# Patient Record
Sex: Male | Born: 2005
Health system: Western US, Academic
[De-identification: ages and names within clinical notes are randomized; demographics above are authoritative.]

## PROBLEM LIST (undated history)

## (undated) DIAGNOSIS — J45909 Unspecified asthma, uncomplicated: Secondary | ICD-10-CM

## (undated) DIAGNOSIS — T7840XA Allergy, unspecified, initial encounter: Secondary | ICD-10-CM

## (undated) HISTORY — DX: Unspecified asthma, uncomplicated: J45.909

## (undated) HISTORY — DX: Allergy, unspecified, initial encounter: T78.40XA

---
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
## 2023-01-06 ENCOUNTER — Ambulatory Visit: Payer: BLUE CROSS/BLUE SHIELD

## 2023-01-06 DIAGNOSIS — G8929 Other chronic pain: Secondary | ICD-10-CM

## 2023-01-06 DIAGNOSIS — M25512 Pain in left shoulder: Secondary | ICD-10-CM

## 2023-01-06 NOTE — Progress Notes
Procedures   Arthrogram with intra-articular introduction of contrast  and needle placement under ultrasound guidance  of the left shoulder      Performed by:  Viveca Beckstrom M. Yolander Goodie, MD      Procedure:  Following informed consent the left shoulder was prepped in a sterile manner.  The shoulder joint was identified using a GE LogiqE ultrasound machine and 12mhz linear probe.  The ultrasound guidance is medically necessary to confirm placement of the contrast solution intra-articularly within the joint.  The shoulder was injected with lidocaine for local anesthetic.  Via an anterior approach a 22-guage needle was then advanced under ultrasound guidance into the shoulder joint and 20 ml of a 1:100 gadolinium/saline solution was introduced into the joint.  A bandage was then applied and the shoulder was rotated several times.      The patient tolerated the procedure well without any immediate complications.  The patient then underwent MRI scan of the shoulder.

## 2023-02-24 ENCOUNTER — Ambulatory Visit: Payer: BLUE CROSS/BLUE SHIELD

## 2023-04-17 ENCOUNTER — Ambulatory Visit: Payer: BLUE CROSS/BLUE SHIELD

## 2023-04-17 ENCOUNTER — Inpatient Hospital Stay: Payer: BLUE CROSS/BLUE SHIELD

## 2023-04-17 DIAGNOSIS — S99921A Unspecified injury of right foot, initial encounter: Secondary | ICD-10-CM

## 2023-04-17 MED ADMIN — TETANUS-DIPHTH-ACELL PERTUSSIS 5-2.5-18.5 LF-MCG/0.5 IM SUSY: .5 mL | INTRAMUSCULAR | @ 22:00:00 | Stop: 2023-04-17

## 2023-04-18 ENCOUNTER — Ambulatory Visit: Payer: BLUE CROSS/BLUE SHIELD

## 2023-04-18 DIAGNOSIS — S92411B Displaced fracture of proximal phalanx of right great toe, initial encounter for open fracture: Secondary | ICD-10-CM

## 2023-04-18 MED ORDER — OXYCODONE HCL 5 MG PO TABS
5 mg | ORAL_TABLET | ORAL | 0 refills | Status: AC | PRN
Start: 2023-04-18 — End: ?

## 2023-04-18 MED ORDER — CEPHALEXIN 500 MG PO CAPS
500 mg | ORAL_CAPSULE | Freq: Four times a day (QID) | ORAL | 0 refills | Status: AC
Start: 2023-04-18 — End: ?

## 2023-04-18 NOTE — Progress Notes
[facility]  [address]  [address]  [place] [address]  Phone: [phone]  FAX: [phone]    URGENT CARE VISIT - ADULT    Subjective:     CC  Foot Injury (Rt foot injury yesterday )    HPI  Patrick Raymond is a 17 y.o. male with PMH lumbar sprain, presenting w/ concern for R foot injury.     Pt was barefoot yesterday in his friend's backyard when he dropped a large fire grate (~50lbs) onto his foot. Had a significant laceration to the area at the base of his first toe. Significant pain with greatly decreased ROM of first toe. Entire foot is now swollen with decreased sensation in tips of all toes. Pt with significant pain and has not placed dressing on foot.         Review of Systems  Complete review of systems was performed and found to be negative except as noted in HPI.     Past Medical History  Patient Active Problem List   Diagnosis    Lumbar sprain    Onychocryptosis     He has no past medical history on file.    Social History  Social History     Socioeconomic History    Marital status: Single   Tobacco Use    Smoking status: Never    Smokeless tobacco: Never     Medications/Supplements  Medications that the patient states to be currently taking   Medication Sig    Clindamycin Phos-Benzoyl Perox 1.2-3.75 % GEL      Current Facility-Administered Medications for the 04/17/23 encounter (Office Visit) with Antony Blackbird., MD   Medication    [COMPLETED] Tdap vaccine (Boostrix) inj (Age 15 years and older) 0.5 mL     Allergies  Patient has no known allergies.    Objective:     Physical Exam  BP 121/77  ~ Pulse 90  ~ Resp 18  ~ Ht 5' 10'' (1.778 m)  ~ Wt 165 lb (74.8 kg)  ~ SpO2 99%  ~ BMI 23.68 kg/m?   General: Appears comfortable, no acute distress, A&Ox4  HEENT: NCAT, PERRL, sclera non-icteric, OP clear, MMM  Neck: Supple, FROM, no stridor  Resp: CTAB, no r/w/r  Skin: No rashes, no jaundice  Ext: No c/c/e, WWP  Musculoskeletal: 2cm laceration at base of R 1st toe, significant edema and erythema throughout R foot, decreased sensation at tips of all toes. No AROM of 1st great toe.   Neuro: MAE spont. Sensation grossly intact throughout  Psychiatric: Mood, memory, affect and judgment normal    Recent Labs/Studies  No results found for this or any previous visit (from the past 168 hour(s)).    Recent Imaging Results  XR foot ap+lat+obl right (3 views)    Result Date: 04/17/2023  IMPRESSION: Acute transverse fracture of the first proximal phalanx, which exhibits dorsomedial displacement by nearly 1 shaft length. Surrounding soft tissue swelling. No dislocation. Incidental bipartite medial hallux sesamoid. Joint spaces are maintained. ATTESTATION: I, Maryellen Pile, M.D., have reviewed this radiological study personally, and I am in full agreement with the findings of the report presented above. Dictated by: Madelyn Brunner   04/17/2023 3:25 PM Signed by: Maryellen Pile   04/17/2023 3:35 PM     Assessment/Plan:     1. Injury of right foot, initial encounter  Tdap vaccine (Boostrix) inj (Age 15 years and older) 0.5 mL    XR foot ap+lat+obl right (3  views)        - Displaced acute transverse 1st proximal phalanx fracture with surrounding soft tissue swelling concerning for possible incipient infection.   - Tdap x1 given dirty wound and 8 years since last Tdap.   - Advised pt to present to ER for IV antibiotics, management of acute fracture, suturing of laceration. Pt agreeable with plan.   - Provided crutches, dressed wound.     40 minutes was spent by me performing a pre-visit review of the chart, obtaining an appropriate history/examining the patient, performing an evaluation, documenting, counseling, providing aftercare instructions, and communication with nursing staff for this patient's encounter today, with details supported within the note for today's visit. The time documented was exclusive of any time spent on the separately billed procedure.    No follow-ups on file.     New or Modified Medications for this Encounter   New    No medications on file   Modified    No medications on file   Discontinued Medications    No medications on file        Antony Blackbird, MD  04/17/2023 at 9:14 PM

## 2023-04-18 NOTE — Consults
04/18/2023       The patient was seen in our [facility] office.    Chief Complaint:Pain of the Right Foot    History of the present illness:    Patrick Raymond is a 17 y.o. right handed male who notes that they experienced the onset of pain on 04/17/2023.    The pain is located in the big toe.  .    The patient rates the pain 7 on a scale of 1 to 10 and describes the pain as throbbing. The pain is staying the same. The pain is associated with swelling  . The patient states that nothing makes the pain better. The patient states that moving it around makes the pain worse.      Patient can no longer perform the following activity due to the injury:walk    Injury occur at work:    Injury reported to work:      The patient relates prior injury:No    Other physician seen for this problem:Yes [facility]     Treatment thus far has included:                          Crutches Duration: 1 day Did it help:: Yes                Tests performed:    X-Jadore Mcguffin Date and Location: 04/18/2023                     Past Medical History:    Patient has a past medical history of Asthma.     Past Surgical History:    Patient has no past surgical history on file.      Medications:    Patient has a current medication list which includes the following prescription(s): adapalene, clindamycin phos-benzoyl perox, and doxycycline hyclate.      Allergies:    Patient has no known allergies.   Latex Allergy:No  Tape Allergy:No    Family History:     Patient family history is not on file.     Social History:    Patient reports that he has never smoked. He has never used smokeless tobacco. He reports that he does not drink alcohol and does not use drugs.     Living Situation:with family   Hobbies/Sports:golfing, fishing        04/18/2023     7:32 AM   Review of Systems   Fevers No   Unusual fatigue No   Very sudden vision change No   Eye pain or irritation No   Runny nose No   Sore throat No   Chest pain No Swelling of legs/ankles No   Cough No   Shortness of breath No   Nausea No   Vomiting No   Abdominal pain No   Diarrhea No   Constipation No   Genital lesions No   Unusual discharge No   Pain on urination No   Body aches No   Joint aches No   Skin lesions No   Rash No   Weakness No   Numbness No   Depression No   Anxiety No   Weight change No   Hair loss No   Unusually frequent urination No   Bleeding No   Swollen lymph nodes (glands) No   Allergies No   Unusually frequent infections No

## 2023-04-18 NOTE — Progress Notes
Date:  04/18/2023  Name:  Patrick Raymond  Centricity#: [id number]  DOB:   [birth year]  Age:   17 y.o.      Vernal Rutan D. [name], MD    The patient was seen in my [facility] office.    Referring Physician: None    Chief Complaint: Right Foot Pain    History of Present Illness: This is an 17 y.o. male with a 2 day history of pain in his right foot. The date of injury was Sunday night. He was attempting to carry a firepit when it dropped onto his right foot. He was seen at [facility] where he was evaluated with X-RAYs, showing a fracture of the 1st proximal phalanx. The right great toe sustained open wounds and dressings were applied over. He was then placed into a fracture shoe and put on crutches.    Present Complaints: Pain is localized to the right foot. The patient rates the pain 7 on a scale of 1 to 10 and describes the pain as throbbing. The pain is staying the same. The pain is associated with swelling. The patient states that nothing makes the pain better. The patient states that moving it around makes the pain worse.     Past History of Present Illness: [As stated above]    Occupation: Consulting civil engineer     Hobbies/Sports: Golfing, Fishing    PAST MEDICAL HISTORY  Allergies: No Known Allergies    Medications:   Outpatient Medications Prior to Visit   Medication Sig    adapalene 0.3% gel APPLY A PEA SIZE AMOUNT TO ACNE 3X A WEEK AT NIGHT.    Clindamycin Phos-Benzoyl Perox 1.2-3.75 % GEL     doxycycline hyclate 100 mg capsule      No facility-administered medications prior to visit.     Medical History:   Past Medical History:   Diagnosis Date    Asthma      Surgical History: No past surgical history on file.    Family History: No family history on file.    Social History: Patient reports that he has never smoked. He has never used smokeless tobacco. He reports that he does not drink alcohol and does not use drugs.     Review of Systems: Mostly good health throughout lifetime. No recent fevers, chills, or unexpected weight loss. The review of remaining systems: HEENT, Endocrine, Skin, CV, Respiratory, GI, GU, Neuro/Psych, Hematologic are all negative. Musculoskeletal is discussed as above.    Vital Signs:  Vitals:    04/18/23 1306   Weight: 165 lb (74.8 kg)   Height: 5' 10'' (1.778 m)     Last Recorded Vital Signs:    04/18/23 1306   BP: 142/69   Pulse: 71     PHYSICAL EXAM:   Constitutional: The patient is well developed, well nourished, and in no acute distress. Patient was seen with his parents.  Eyes: No conjunctivitis is present; eyelids appear normal.  Cardiovascular: Intact with normal pulses, skin temperature. No cyanosis, clubbing, or edema is evident.  Psych: Patient is alert and oriented x 3.  Pulmonary: Normal respiratory rate and effort.  Vascular: Intact with normal skin temperature, no varicosities.  Neuro: Normal coordination, sensation to light touch grossly intact.  Skin: No lesions, scars, erythema, edema, or other skin changes.    ANKLE/FOOT EXAMINATION: Examination of the right foot reveals the following findings.  APPLIANCES: Patient is not wearing any brace or support.   VISUAL ALIGNMENT: Standing alignment reveals hindfoot valgus.  FOOT MORPHOLOGY: Morphology reveals pes planus.   APPEARANCE: He has significant swelling, ecchymosis, edema. There are lacerations of the great toe.  GAIT: Patient cannot walk. He cannot walk on his heels and tiptoes. He cannot perform a single toe-leg raise.   LIGAMENTOUS LAXITY: There is no generalized ligamentous laxity.   PALPATION: Patient is tender to palpation of the right great toe.  GREAT TOE RANGE OF MOTION: Diminished in all planes.  ANKLE RANGE OF MOTION: Diminished in all planes.  MOTOR TESTING: Strength is 3/5 in all planes.     LIGAMENT STABILITY: Stable.  SENSATION: Sensation is intact to light touch.     X-RAYS: X-RAYs of the right foot from Urgent Care on 04/17/23 show Acute transverse fracture of the first proximal phalanx, which exhibits dorsomedial displacement by nearly 1 shaft length. Surrounding soft tissue swelling. No dislocation. Incidental bipartite medial hallux sesamoid. Joint spaces are maintained.    IMPRESSION:   Significant acute open transverse fracture of the right 1st proximal phalanx with displacement.    PLAN:   Under sterile conditions, the right great toe lacerations were debrided and cleaned. New foot dressings were applied over.  Due to the open displaced nature of the 1st proximal phalanx fracture, he requires surgery urgently. This includes a irrigation and debridement of the open fracture proximal phalanx and ORIF of the displaced right 1st proximal phalanx fracture. We discussed with the patient and his parents about surgery, post-operative care, and recovery.  He will go into bulky jones dressings and removable splint. He will remain nonweightbearing.  He will continue to elevate the right foot to reduce swelling.  He will start on Keflex 500 mg today.  The patient was provided with a leg wedge pillow today to facilitate elevation of the extremity after surgery. Significant swelling can occur after surgery, leading to prolonged wound healing, persistent pain, and delayed recovery.  This special pillow keeps the ankle and foot position to facilitate draining, prevent twisting or turning, and promotes quicker healing while allowing the patient to rest comfortable while recovering from the procedure. In addition, when patients are being transferred back and forth to their office appointments from their home, they use this pillow in the car while sitting in the back seat. Therefore, this supply is felt to be necessary to help in the recovery process.   We will proceed with surgery. The patient is aware of the risks of bleeding, infection, damage to the nerves/vessels, persistent pain and stiffness, malunion, nonunion, need of hardware removal, loss of limb, loss of life. The patient gives informed consent understanding clearly the benefits, risks and alternatives.    TIME: I spent a total of 50 minutes today to provide care for this patient. This includes time spent prior to, during, and after the patient's appointment to:   Review the patient's past medical history as well as any relevant prior testing, laboratory and imaging results in preparation for the visit  2. Obtain an adequate history and understanding of the patient's chief complaint  3. Perform the necessary examination and/or order any tests, labs, or imaging for further evaluation  4. Discuss the plan and any differential/diagnoses with the patient  5. Counsel/educate the patient if needed  6. Coordinate care for the patient if needed.       I have reviewed my findings with the patient. All questions were answered and the patient stated understanding. He was advised to call if he has any additional questions or concerns.  cc: []           Scribe Attestation:    I, [name], was the scribe for patient Patrick Raymond on 04/18/2023 in the presence of Dr. Philis Fendt.       Physician Signatures:    I, Dr. Philis Fendt, personally performed the services described in this documentation, as scribed by [name] in my presence, and it is both accurate and complete.       Philis Fendt, MD, FAOA  Assistant Clinical Professor of Orthopedic Surgery, [facility]  Director, Sports Medicine Fellowship, [facility]  04/18/2023

## 2023-04-27 ENCOUNTER — Ambulatory Visit: Payer: BLUE CROSS/BLUE SHIELD

## 2023-04-27 DIAGNOSIS — S92411B Displaced fracture of proximal phalanx of right great toe, initial encounter for open fracture: Secondary | ICD-10-CM

## 2023-04-27 NOTE — Progress Notes
Date:  04/27/2023  Name:  Patrick Raymond  Centricity#: [id number]  DOB:   April 19, 2006  Age:   17 y.o.    Patrick Baskette D. Patrick Passy, MD      The patient was seen in our [facility] office.    INTERIM HISTORY: Patrick Raymond presents today for a follow up visit on his right foot. He is 1 week s/p irrigation, debridement, and ORIF of the displaced open great toe proximal phalanx fracture on 04/19/23. He is overall doing well post surgery.    PHYSICAL EXAMINATION: On exam, patient is a pleasant male, alert and oriented x3. He presents with a cast and uses crutches to ambulate. He was seen with his father. Examination of the right foot show well healing incisions with stitches in place. There is swelling and resolving ecchymosis along the incision sites. No signs of infection. His range of motion and strength are both diminished. He is able to wiggle his toes and gently move the ankle. Sensation is intact to light touch.    X-RAYS: None    IMPRESSION:   He is making good progress s/p irrigation, debridement, and ORIF of the displaced open great toe proximal phalanx fracture on 04/19/23 by Dr. Philis Fendt.    PLAN:   Under sterile conditions, the incision sites were cleaned and new dressings were applied. Stitches were left in place.  He will go into a shortleg cast and continue to be nonweightbearing.  He will continue to keep the left ankle elevated and dry.  Return to clinic in 2 weeks for evaluation. At that time he should have new X-RAYs, 3 views of the right foot, for review.    I have reviewed my findings with the patient. All questions were answered and the patient stated understanding. He was advised to call if he has any additional questions or concerns.          Scribe Attestation:    I, [name], was the scribe for patient Patrick Raymond on 04/27/2023 in the presence of Dr. Philis Fendt.       Physician Signatures:    I, Dr. Philis Fendt, personally performed the services described in this documentation, as scribed by [name] in my presence, and it is both accurate and complete.       Philis Fendt, MD, FAOA  Assistant Clinical Professor of Orthopedic Surgery, [facility]  Director, Sports Medicine Fellowship, [facility]  04/27/2023

## 2023-05-09 ENCOUNTER — Ambulatory Visit: Payer: BLUE CROSS/BLUE SHIELD

## 2023-05-10 MED ORDER — CEPHALEXIN 500 MG PO CAPS
500 mg | ORAL_CAPSULE | Freq: Four times a day (QID) | ORAL | 0 refills | Status: AC
Start: 2023-05-10 — End: ?

## 2023-05-11 ENCOUNTER — Ambulatory Visit: Payer: BLUE CROSS/BLUE SHIELD

## 2023-05-11 DIAGNOSIS — S92411B Displaced fracture of proximal phalanx of right great toe, initial encounter for open fracture: Secondary | ICD-10-CM

## 2023-05-11 NOTE — Progress Notes
Date:  05/11/2023  Name:  Patrick Raymond  Centricity#: [id number]  DOB:   06-08-2005  Age:   17 y.o.    Shawnmichael Parenteau D. [name], MD      The patient was seen in our [facility] office.    INTERIM HISTORY: Odes Pint presents today for a follow up visit on his right foot. He is 3 weeks s/p irrigation, debridement, and ORIF of the displaced open great toe proximal phalanx fracture on 04/19/23. He is overall doing fine. Patient's lab work came back positive for Staphylococcus lugdunensis. We sent him antibiotics on Tuesday of this which which his mother states they have picked up and he is now taking.    PHYSICAL EXAMINATION: On exam, patient is a pleasant male, alert and oriented x3. He presents with a cast and uses crutches to ambulate. He was seen with his mother. Examination of the right foot show well healing incisions with stitches in place. There is swelling and resolving ecchymosis along the incision sites. The pin remains in position at the great toe proximal phalanx. No obvious signs of infection. No evidence of DVT. His range of motion and strength are both diminished. He is able to wiggle his toes and gently move the ankle. Sensation is intact to light touch.    X-RAYS: 3 views of the right foot were obtained and reviewed today. These reveal 3 pins in good position and alignment at the ORIF site of the great toe proximal phalanx fracture.    IMPRESSION:   S/p rare Staphylococcus lugdunensis infection, r/o contamination.   S/p irrigation, debridement, and ORIF of the displaced open great toe proximal phalanx fracture on 04/19/23 by Dr. Philis Fendt.    PLAN:   Under sterile conditions, the incision sites were cleaned and stitches were removed. New dressings were applied.  He will go into a shortleg cast and continue to be nonweightbearing.  He will continue to keep the left ankle elevated and dry.  He will continue taking Keflex 500 mg for 4 weeks.  We will remove his pin(s) in 6 weeks post-op but may require surgical removal pending healing.  Return to clinic in 2 weeks for evaluation.    I have reviewed my findings with the patient. All questions were answered and the patient stated understanding. He was advised to call if he has any additional questions or concerns.          Scribe Attestation:    I, [name], was the scribe for patient Krishay Allert on 05/11/2023 in the presence of Dr. Philis Fendt.       Physician Signatures:    I, Dr. Philis Fendt, personally performed the services described in this documentation, as scribed by [name] in my presence, and it is both accurate and complete.       Philis Fendt, MD, FAOA  Assistant Clinical Professor of Orthopedic Surgery, [facility]  Director, Sports Medicine Fellowship, [facility]  05/11/2023

## 2023-05-12 ENCOUNTER — Ambulatory Visit: Payer: BLUE CROSS/BLUE SHIELD

## 2023-05-12 DIAGNOSIS — S92411B Displaced fracture of proximal phalanx of right great toe, initial encounter for open fracture: Secondary | ICD-10-CM

## 2023-05-16 MED ORDER — CEPHALEXIN 500 MG PO CAPS
500 mg | ORAL_CAPSULE | Freq: Four times a day (QID) | ORAL | 0 refills | Status: AC
Start: 2023-05-16 — End: ?

## 2023-05-16 NOTE — Addendum Note
Addended by: Juanita Laster on: 05/16/2023 01:31 PM     Modules accepted: Orders

## 2023-05-24 ENCOUNTER — Ambulatory Visit: Payer: BLUE CROSS/BLUE SHIELD

## 2023-05-24 DIAGNOSIS — M79672 Pain in left foot: Secondary | ICD-10-CM

## 2023-05-24 NOTE — Procedures
Patient came to [facility] cast room for a cast change.

## 2023-05-27 MED ORDER — CEPHALEXIN 500 MG PO CAPS
500 mg | ORAL_CAPSULE | Freq: Four times a day (QID) | ORAL | 0 refills
Start: 2023-05-27 — End: ?

## 2023-05-29 MED ORDER — CEPHALEXIN 500 MG PO CAPS
500 mg | ORAL_CAPSULE | Freq: Four times a day (QID) | ORAL | 0 refills
Start: 2023-05-29 — End: ?

## 2023-06-02 ENCOUNTER — Ambulatory Visit: Payer: BLUE CROSS/BLUE SHIELD

## 2023-06-02 DIAGNOSIS — S92411B Displaced fracture of proximal phalanx of right great toe, initial encounter for open fracture: Secondary | ICD-10-CM

## 2023-06-02 MED ORDER — CEPHALEXIN 500 MG PO CAPS
500 mg | ORAL_CAPSULE | Freq: Four times a day (QID) | ORAL | 0 refills | Status: AC
Start: 2023-06-02 — End: ?

## 2023-06-03 MED ORDER — HYDROCODONE-ACETAMINOPHEN 5-325 MG PO TABS
1 | ORAL_TABLET | Freq: Four times a day (QID) | ORAL | 0 refills | Status: AC | PRN
Start: 2023-06-03 — End: ?

## 2023-06-06 NOTE — Progress Notes
Marcellous Snarski D. [name], MD    [place], [place] [id number]    June 02, 2023    The patient was seen in my office.         A followup visit on a private patient regarding his right foot.         HISTORY/INTERIM HISTORY:  The patient returns today.  He is about 6 weeks status post ORIF open fracture of his proximal phalanx of the great toe.  He is feeling better.  He has minimal pain.  He has been ambulating in his cast.         PHYSICAL EXAMINATION:  Pleasant male oriented x3.  The toe looks good.  The swelling is down.  Pins are dry and clean.  There is no evidence of any infection.  Good capillary fill.  Neurologically unchanged.         ASSESSMENT:  Status post ORIF, right great toe proximal phalanx open fracture.         PLAN:  We are taking him to surgery to remove the central pin and the lateral pin.  We may leave the medial pin in for a couple more weeks and make a decision at the time of surgery.  We also will manipulate his toe very gently to try to get some of the stiffness out of the IP joint under fluoroscopy.         The patient understands the proposed procedure, including the risks of bleeding, infection, damage to the nerves/vessels, refracture, need for further surgery, loss of limb, loss of life. He gives informed consent, understanding clearly the benefits, risks, and alternatives.         The patient did have 3 views of his right foot today.  It showed the 3 pins in place.  The fracture is healing medially and laterally.  He is not 100% healed yet, but it looks significantly better than previously seen.                                                                           Uilani Sanville D. [name], MD  ORTHOPEDIC SURGERY  Document electronically signed in EHR. See separate signature page.      J:  [id number]  D:  06/04/23  T:  06/05/23

## 2023-06-08 ENCOUNTER — Ambulatory Visit: Payer: BLUE CROSS/BLUE SHIELD

## 2023-06-08 DIAGNOSIS — S92411B Displaced fracture of proximal phalanx of right great toe, initial encounter for open fracture: Secondary | ICD-10-CM

## 2023-06-09 NOTE — Progress Notes
Date:  06/08/2023  Name:  Patrick Raymond  Centricity#: [id number]  DOB:   March 30, 2006  Age:   18 y.o.    Patrick Raymond D. Patrick Passy, MD      The patient was seen in our [facility] office.    INTERIM HISTORY: Patrick Raymond presents today for a follow up visit on his right foot. He is 1 week s/p removal of K-wire from across the IP joint, removal of K-wire from the lateral aspect of the proximal phalanx across the fracture site, and manipulation of the right great toe IP joint under fluoroscopy on 06/02/23. He is overall doing well post-surgery.    PHYSICAL EXAMINATION: On exam, patient is a pleasant male, alert and oriented x3. He presents in a splint and uses crutches. Patient was seen with his mother. Examination of the right foot show well healing incisions. There is swelling and resolving ecchymosis along the incision sites. No signs of infection. His range of motion and strength are both diminished. He is able to wiggle his toes and gently move the ankle. Sensation is intact to light touch    X-RAYS: None    IMPRESSION:   He is making good progress s/p removal of K-wire from across the IP joint, removal of K-wire from the lateral aspect of the proximal phalanx across the fracture site, and manipulation of the right great toe IP joint under fluoroscopy on 06/02/23 by Dr. Philis Raymond.  S/p irrigation, debridement, and ORIF of the displaced open great toe proximal phalanx fracture on 04/19/23 by Dr. Philis Raymond.    PLAN:   Under sterile conditions, the incision sites were cleaned and foot strapping was applied.  He will go into a short leg boot today. Increase WB.  He will continue to keep the left ankle elevated and dry.  Return to clinic in 1 week for evaluation.    I have reviewed my findings with the patient. All questions were answered and the patient stated understanding. He was advised to call if he has any additional questions or concerns.          Scribe Attestation:    I, Patrick Raymond, was the scribe for patient Patrick Raymond on 06/08/2023 in the presence of Dr. Philis Raymond.       Physician Signatures:    I, Dr. Philis Raymond, personally performed the services described in this documentation, as scribed by Patrick Raymond in my presence, and it is both accurate and complete.       Patrick Fendt, MD, FAOA  Assistant Clinical Professor of Orthopedic Surgery, [facility]  Director, Sports Medicine Fellowship, [facility]  06/08/2023

## 2023-06-14 ENCOUNTER — Ambulatory Visit: Payer: BLUE CROSS/BLUE SHIELD | Attending: Student in an Organized Health Care Education/Training Program

## 2023-06-14 DIAGNOSIS — S61209A Unspecified open wound of unspecified finger without damage to nail, initial encounter: Secondary | ICD-10-CM

## 2023-06-15 ENCOUNTER — Ambulatory Visit: Payer: BLUE CROSS/BLUE SHIELD

## 2023-06-15 DIAGNOSIS — S92411B Displaced fracture of proximal phalanx of right great toe, initial encounter for open fracture: Secondary | ICD-10-CM

## 2023-06-15 NOTE — Addendum Note
Addended by: Philis Fendt D on: 06/15/2023 03:43 PM     Modules accepted: Orders

## 2023-06-15 NOTE — Progress Notes
Date:  06/15/2023  Name:  Patrick Raymond  Centricity#: [id number]  DOB:   05-22-2006  Age:   18 y.o.    Alicianna Litchford D. [name], MD      The patient was seen in our [facility] office.    INTERIM HISTORY: Patrick Raymond presents today for a follow up visit on his right foot. He is 2 weeks s/p removal of K-wire from across the IP joint, removal of K-wire from the lateral aspect of the proximal phalanx across the fracture site, and manipulation of the right great toe IP joint under fluoroscopy on 06/02/23. He is also s/p irrigation, debridement, and ORIF of the displaced open great toe proximal phalanx fracture on 04/19/23. He is overall continuing to do well. He was been WB in his boot.    PHYSICAL EXAMINATION: On exam, patient is a pleasant male, alert and oriented x3. Patient was seen with his mother. Examination of the right foot show well healing incisions. There is swelling and resolving ecchymosis along the incision sites. No signs of infection. The in remains in place at the great toe medially. His range of motion and strength are both diminished. He is able to wiggle his toes and gently move the ankle. Sensation is intact to light touch    X-RAYS: (POST-PIN REMOVAL) 3 views of the right foot were obtained and reviewed today. These reveal the pin to be removed from the great toe IP joint. Everything appears to be in excellent position and alignment.    IMPRESSION:   He is making good progress s/p removal of K-wire from across the IP joint, removal of K-wire from the lateral aspect of the proximal phalanx across the fracture site, and manipulation of the right great toe IP joint under fluoroscopy on 06/02/23 by Dr. Philis Fendt.  S/p irrigation, debridement, and ORIF of the displaced open great toe proximal phalanx fracture on 04/19/23 by Dr. Philis Fendt.    PLAN:   Under sterile conditions, the incision sites were cleaned. The pin was pulled and the patient tolerated the procedure well. New dressings were applied.  He will go into a boot for now but will transition to using a shoe with good support.  He will start physical therapy for the right foot. A prescription was provided today.  Return to clinic in 3 weeks for evaluation with Juanita Laster, PA-C, ATC.    I have reviewed my findings with the patient. All questions were answered and the patient stated understanding. He was advised to call if he has any additional questions or concerns.          Scribe Attestation:    I, [name], was the scribe for patient Patrick Raymond on 06/15/2023 in the presence of Dr. Philis Fendt.       Physician Signatures:    I, Dr. Philis Fendt, personally performed the services described in this documentation, as scribed by [name] in my presence, and it is both accurate and complete.       Philis Fendt, MD, FAOA  Assistant Clinical Professor of Orthopedic Surgery, [facility]  Director, Sports Medicine Fellowship, [facility]  06/15/2023

## 2023-06-15 NOTE — Progress Notes
[facility] IMMEDIATE CARE CLINIC  [address]  [place] [address]  Phone: [phone]  Fax: [phone]        PATIENT: Patrick Raymond  MRN: [id number]  DOB: August 14, 2005  DATE OF SERVICE: 06/14/2023    CHIEF COMPLAINT:   Chief Complaint   Patient presents with    Laceration     L Ring finger - 1 day       Patient informed of chaperone program: Patient/Legal Guardian Declined        HPI   [name] is a 18 y.o. male presents for   Chief Complaint   Patient presents with    Laceration     L Ring finger - 1 day      Patient presents with left ring finger injury that happened 1 day ago. States that injured finger with a hammer that cut the skin.  States washed out the wound and says tetanus is up to date. Denies other injuries.    Not Found in Not Found on [id number] is not a valid rule ID.      CHRONIC CONDITIONS     #    []  Stable  []  Improved  []  Worsened   #    []  Stable  []  Improved  []  Worsened   #   []  Stable  []  Improved  []  Worsened       MEDS     Medications that the patient states to be currently taking   Medication Sig    adapalene 0.3% gel APPLY A PEA SIZE AMOUNT TO ACNE 3X A WEEK AT NIGHT.    Clindamycin Phos-Benzoyl Perox 1.2-3.75 % GEL     doxycycline hyclate 100 mg capsule     HYDROcodone-acetaminophen 5-325 mg tablet Take 1 tablet by mouth every six (6) hours as needed for Severe Pain (Pain Scale 7-10). Max Daily Amount: 4 tablets    oxyCODONE 5 mg tablet Take 1 tablet (5 mg total) by mouth every four (4) hours as needed. Max Daily Amount: 30 mg       PHYSICAL EXAM      Last Recorded Vital Signs:    06/14/23 1945   BP: 128/86   Pulse: 88   Temp: 36.4 ?C (97.6 ?F)   SpO2: 98%     Body mass index is 22.96 kg/m?Marland Kitchen    System Check if normal Positive or additional negative findings   Constit  [x]  General appearance     Eyes  [x]  Conj/Lids []  Pupils  []  Fundi     HENMT  [x]  External ears/nose []  Otoscopy   []  Gross Hearing []  Nasal mucosa   []  Lips/teeth/gums []  Oropharynx    []  mucus membranes []  Head     Neck  []  Inspection/palpation []  Thyroid     Resp  [x]  Effort []  Wheezing    []  Auscultation  []  Crackles     CV  [x]  Rhythm/rate   []  Murmurs   []  LEE   []  JVP non-elevated    Normal pulses:   []  Radial []  Femoral  []  Pedal     Breast  []  Inspection []  Palpation     GI  []  abd masses    []  tenderness   []  rebound/guarding   []  Liver/spleen []  Rectal     GU  M: []  Scrotum []  Penis []  Prostate   F:  []  External []  vaginal wall        []  Cervix  []  mucus        []   Uterus    []  Adnexa      Lymph  []  Neck []  Axillae []  Groin     MSK Specify site examined:    []  Inspect/palp [x]  ROM   []  Stability []  Strength/tone      Left ring finger with skin avulsion at base of digit. No evidence of tendon laceration, had full intact range of motion of the digit.   Skin  []  Inspection []  Palpation     Neuro  []  CN2-12 intact grossly   [x]  Alert and oriented   []  DTR      []  Muscle strength      []  Sensation   [x]  Gait/balance     Psych  [x]  Insight/judgement     [x]  Mood/affect    [x]  Gross cognition        LABS/STUDIES   I have:   []  Reviewed/ordered []  1 []  2 []  >= 3 unique laboratory, radiology, and/or diagnostic tests noted below    []  Reviewed []  1 []  2 []  >= 3 prior external notes and incorporated into patient assessment    []  Discussed management or test interpretation with external provider(s) as noted    []  Independent interpretation of test       Lab Studies:  CBC:      CMP:    BMP:       Imaging Studies:   Last CXR, PA/L: No CXR found in the last 30 days    A&P   [name] is a a 18 y.o. male presenting for   Chief Complaint   Patient presents with    Laceration     L Ring finger - 1 day        Patient presents for skin avulsion of left ring finger. Has been a day since the injury occurred so will not provide sutures to avoid risk of infection given. Will provide wound care, advised on water soap to clean wound, and provided 2 steri-strips. No acute concerns for tendon laceration or bony exposure. And doubt fractures/dislocation.     ASSESSMENT AND PLAN  Diagnoses and all orders for this visit:    Avulsion of skin of finger without complication, initial encounter        Patient Instructions   We evaluated your finger skin wound and provided you with wound care. There are no concerns for fractures or tendon lacerations we suspect. Continue home wound care trial bacitracin ointment, water and soap to wash wound.     No follow-ups on file.     Future Appointments   Date Time Provider Department Center   06/15/2023  2:20 PM Belva Agee., MD [facility]        PROBLEM  []  Self-limited/minor: []  1   []  2  []  Chronic stable  []  1   []  2  [x]  Acute, uncomplicated illness or injury [x]  1   []  2  []  Chronic unstable  []  1   []  2  []  Undiagnosed/uncertain prognosis []  1   []  2  []  Acute systemic illness/acute complicated injury  []  1   []  2  []  Chronic severe exacerbation  []  1   []  2  []  Acute or chronic causing threat to life/bodily function  []  1   []  2      REVIEW OF DATA  Category 1:   [x]  Review of prior studies (listed above)  [x]  Review of prior labs, procedures, tests (listed above) []  1   []  2   []   3+  [x]  Review of prior/external notes  [x]  Review of new data (listed above)  []  Ordering medications, laboratory, diagnostic tests or procedures []  1   []  2   []  3+  []  Communication with an independent historian (16109, 99214 only)  Category 2:   []  Communication with an independent historian (60454, 99213 only)  []  Independent interpretation of test not separately reported  Category 3:   []  Discussion of care with physician in external department     RISK OF COMPLICATION  []  Minimal     [x]  Low   []  Moderate   []  Severe     SOCIAL DETERMINANTS OF HEALTH  []  The diagnosis or treatment of said conditions is significantly limited by social determinants of health    BILLING BY TIME  15 minutes were spent personally by me today on this encounter which includes today's pre-visit review of the chart, obtaining appropriate history, performing and evaluation, documentation and discussion of management with details supported within the note for today's visit. The time documented was exclusive of any time spent on the separately billed procedure.         The above plan of care, diagnoses, orders, and follow-up were discussed with the patient. Questions related to this recommended plan of care were answered.      Follow up with PCP Pcp, No, MD    Sinclair Grooms, MD  [facility]

## 2023-06-15 NOTE — Patient Instructions
We evaluated your finger skin wound and provided you with wound care. There are no concerns for fractures or tendon lacerations we suspect. Continue home wound care trial bacitracin ointment, water and soap to wash wound.

## 2023-06-21 ENCOUNTER — Ambulatory Visit: Payer: BLUE CROSS/BLUE SHIELD

## 2023-06-21 DIAGNOSIS — S92411B Displaced fracture of proximal phalanx of right great toe, initial encounter for open fracture: Secondary | ICD-10-CM

## 2023-06-21 NOTE — Treatment Summary
Daily Note/ Encounter  Patient: Patrick Raymond  MRN: [id number]  INSURANCE: Payor: Rachael Fee CROSS [place] / Plan: [facility] CROSS [place] PPO / Product Type: PPO /    DOB: Jan 01, 2006  Date of Service: 06/21/2023  Encounter Diagnosis:     ICD-10-CM    1. Displaced fracture of proximal phalanx of right great toe, initial encounter for open fracture  S92.411B          Number of visits : 2    Subjective: Reports toe is feeling ok, using more padded shoes at home.   Objective: R toes swollen, redness across IP joints   [x]  Therapeutic Exercises: 25 minutes  for stretching, strengthening and cardiovascular exercises   Toe curls with towel, alternating toes, heel raises (added weight and half plie), banded DF, PF, ev, inv, SLR and LAQ with weight   []  Neuromuscular Re-education:   minutes for balance, proprioception, movement patter and coordination, PNF    [x]  Therapeutic Activities: 10 minutes  for ADLs, lifting, pulling, pushing, squatting, stair stepping and reaching activities  Bike 8'   [x]  Manual Therapy for 10 minutes to PROM big toe extension and flexion, PA mobs to IP joint      Assessment: Patient responded well to all exercises and introduction of upright bike. Will continue to benefit from skilled PT. Rehab potential is good.   Plan: continue per POC, add WB exercises  Total Time in Clinic: 45 minutes  Total Billable time : 45 minutes

## 2023-06-26 ENCOUNTER — Ambulatory Visit: Payer: BLUE CROSS/BLUE SHIELD

## 2023-06-26 DIAGNOSIS — S92411B Displaced fracture of proximal phalanx of right great toe, initial encounter for open fracture: Secondary | ICD-10-CM

## 2023-06-26 NOTE — Treatment Summary
Daily Note/ Encounter  Patient: Patrick Raymond  MRN: [id number]  INSURANCE: Payor: Rachael Fee CROSS [place] / Plan: [facility] CROSS [place] PPO / Product Type: PPO /    DOB: [birth year]  Date of Service: 06/26/2023  Encounter Diagnosis:     ICD-10-CM    1. Displaced fracture of proximal phalanx of right great toe, initial encounter for open fracture  S92.411B          Number of visits : 3  Subjective: Reports toe is feeling about the same, but less pain with walking now; using more padded shoes.   Objective: Swelling across toes improving, less redness   [x]  Therapeutic Exercises: 25 minutes  for stretching, strengthening and cardiovascular exercises   Toe curls with towel, alternating toes, heel raises (inc weight and half plie), banded ev/inv, SLR inc weight, introduced NK ext/leg press prancing   []  Neuromuscular Re-education:   minutes for balance, proprioception, movement patter and coordination, PNF    [x]  Therapeutic Activities: 10 minutes  for ADLs, lifting, pulling, pushing, squatting, stair stepping and reaching activities  Bike, introduced HR/TR   [x]  Manual Therapy for 10 minutes to PROM big toe extension and flexion, PA mobs to IP joint      Assessment: Patient responded well to WB exercises, with no aggravation of symptoms. Rehab potential is good, will continue to benefit from skilled PT.   Plan: continue pushing WB exercises, try TM   Total Time in Clinic: 45 minutes  Total Billable time : 45 minutes

## 2023-06-28 ENCOUNTER — Ambulatory Visit: Payer: BLUE CROSS/BLUE SHIELD

## 2023-06-28 DIAGNOSIS — S92411B Displaced fracture of proximal phalanx of right great toe, initial encounter for open fracture: Secondary | ICD-10-CM

## 2023-06-28 NOTE — Treatment Summary
Daily Note/ Encounter  Patient: Patrick Raymond  MRN: [id number]  INSURANCE: Payor: Rachael Fee CROSS [place] / Plan: [facility] CROSS [place] PPO / Product Type: PPO /    DOB: 2005/09/24  Date of Service: 06/28/2023  Encounter Diagnosis:     ICD-10-CM    1. Displaced fracture of proximal phalanx of right great toe, initial encounter for open fracture  S92.411B          Number of visits : 4  Subjective: Reports toe feels good, occasional pain at the base of toes with walking, but tolerable.   Objective: Swelling across toes continues to improve, more toe flexion noted    [x]  Therapeutic Exercises: 24 minutes  for stretching, strengthening and cardiovascular exercises   Toe curls with towel, alternating toes, heel raises (inc weight and half plie), banded ev/inv, SLR, NK ext/leg press prancing   []  Neuromuscular Re-education:   minutes for balance, proprioception, movement patter and coordination, PNF  [x]  Therapeutic Activities: 8 minutes  for ADLs, lifting, pulling, pushing, squatting, stair stepping and reaching activities  Introduced TM   [x]  Manual Therapy for 8 minutes to PROM big toe extension and flexion, PA mobs to IP joint      Assessment: Patient appropriately challenged today, able to complete all exercises with no aggravation of symptoms. Responds well to manual therapy, improving toe flexion ROM. Rehab potential is good.   Plan: continue pushing WB exercises,add agility drills   Total Time in Clinic: 40 minutes  Total Billable time : 40 minutes

## 2023-07-03 ENCOUNTER — Ambulatory Visit: Payer: BLUE CROSS/BLUE SHIELD

## 2023-07-03 DIAGNOSIS — S92411B Displaced fracture of proximal phalanx of right great toe, initial encounter for open fracture: Secondary | ICD-10-CM

## 2023-07-03 NOTE — Treatment Summary
Daily Note/ Encounter  Patient: Patrick Raymond  MRN: [id number]  INSURANCE: Payor: Rachael Fee CROSS [place] / Plan: [facility] CROSS [place] PPO / Product Type: PPO /    DOB: [birth year]  Date of Service: 07/03/2023  Encounter Diagnosis:     ICD-10-CM    1. Displaced fracture of proximal phalanx of right great toe, initial encounter for open fracture  S92.411B            Number of visits : 5  Subjective: Reports toe continues to feel better, less pain with walking.   Objective: Swelling across toes remains, increased redness noted   [x]  Therapeutic Exercises: 24 minutes  for stretching, strengthening and cardiovascular exercises   Toe curls with towel, heel raises standing, banded ev/inv, SLR, NK ext/leg press prancing   []  Neuromuscular Re-education:   minutes for balance, proprioception, movement patter and coordination, PNF  [x]  Therapeutic Activities: 8 minutes  for ADLs, lifting, pulling, pushing, squatting, stair stepping and reaching activities  TM   [x]  Manual Therapy for 8 minutes to PROM big toe extension and flexion, PA mobs to IP joint      Assessment: Patient responded well to progression of resistance exercises. Rehab potential is goof.    Plan: continue pushing WB exercises  Total Time in Clinic: 40 minutes  Total Billable time : 40 minutes

## 2023-07-04 NOTE — Progress Notes
Date:  07/06/2023  Name:  Patrick Raymond  Centricity#: [id number]  DOB:   Mar 25, 2006  Age:   18 y.o.    Patrick Laster, PA-C/ATC      The patient was seen in our [facility] office.      INTERIM HISTORY: Patrick Raymond presents today for a follow up visit on his right foot . He is 5 weeks s/p removal of K-wire from across the IP joint, removal of K-wire from the lateral aspect of the proximal phalanx across the fracture site, and manipulation of the right great toe IP joint under fluoroscopy on 06/02/23. He is also s/p irrigation, debridement, and ORIF of the displaced open great toe proximal phalanx fracture on 04/19/23. He is overall doing well. He presents today FWB in supportive shoes. He states that he is not having significant pain in the right foot. He denies any swelling. He states that PT is going well. No specific activities cause him pain.     PHYSICAL EXAMINATION: On exam, patient is a pleasant male, alert and oriented x3. Examination of the right foot shows the incisions to be well healed. There is no significant TTP. Range of motion is slightly diminished with plantar and dorsiflexion of the great toe. Strength is 4+/5 with PF/DF. Gross neurovascular sensation is intact.     X-RAYS: None    IMPRESSION:   s/p removal of K-wire from across the IP joint, removal of K-wire from the lateral aspect of the proximal phalanx across the fracture site, and manipulation of the right great toe IP joint under fluoroscopy on 06/02/23 by Dr. Philis Raymond.  S/p irrigation, debridement, and ORIF of the displaced open great toe proximal phalanx fracture on 04/19/23 by Dr. Philis Raymond.    PLAN:   He will continue to progress with PT.  Discussed with Dr. [name] the patient returning to work and he was cleared today.   Return to clinic in 3 weeks for evaluation with Dr. [name] with new x-rays(3 views of the right foot).      I have reviewed my findings with the patient. All questions were answered and the patient stated understanding. He was advised to call if he has any additional questions or concerns.      Physician Signatures:  The patient was examined and evaluated by Patrick Laster, PA-C,ATC for Patrick Raymond. [name], MD. The evaluation and plan was reviewed and approved by Patrick Raymond. [name], MD.  07/06/2023        Patrick Laster PA-C, ATC  07/06/2023

## 2023-07-05 ENCOUNTER — Ambulatory Visit: Payer: BLUE CROSS/BLUE SHIELD

## 2023-07-05 DIAGNOSIS — S92411B Displaced fracture of proximal phalanx of right great toe, initial encounter for open fracture: Secondary | ICD-10-CM

## 2023-07-05 NOTE — Treatment Summary
Daily Note/ Encounter  Patient: Patrick Raymond  MRN: [id number]  INSURANCE: Payor: Rachael Fee [employer] [place] / Plan: [facility] [employer] [place] PPO / Product Type: PPO /    DOB: 10-Oct-2005  Date of Service: 07/05/2023  Encounter Diagnosis:     ICD-10-CM    1. Displaced fracture of proximal phalanx of right great toe, initial encounter for open fracture  S92.411B          Number of visits : 6    Subjective: Pt denies pain upon arrival to clinic, mentions that his toe is still tight and occasionally gets sore.   Objective: Session focused on intrinsic foot muscular strength, ankle strength in PF/INV/EV/DF  [x]  Therapeutic Exercises: 15 minutes  for stretching, strengthening and cardiovascular exercises   Strength for ankle in all directions, mobility for R great toe, stretching gastroc  [x]  Neuromuscular Re-education:  15 minutes for balance, proprioception, movement patter and coordination, PNF  Balance training, cues for technique, sequencing and pacing  [x]  Therapeutic Activities:  10 minutes  for ADLs, lifting, pulling, pushing, squatting, stair stepping and reaching activities  Stair training, gait training lifting activities   [x]  Manual Therapy for 10 minutes to  PA mobs for R great toe, STW to arch.      Assessment: Pt tolerated session with no increase in pain or adverse symptoms. Introduced some foot intrinsic exercises, tolerated progression with no pain. Pt shows good understanding of HEP and plan of care.  Plan: Continue to progress pt as tolerated within POC  Total Time in Clinic: 60 minutes  Total Billable time : 50 minutes

## 2023-07-06 ENCOUNTER — Ambulatory Visit: Payer: BLUE CROSS/BLUE SHIELD | Attending: Rehabilitative and Restorative Service Providers"

## 2023-07-06 DIAGNOSIS — S92411B Displaced fracture of proximal phalanx of right great toe, initial encounter for open fracture: Secondary | ICD-10-CM

## 2023-07-11 ENCOUNTER — Ambulatory Visit: Payer: BLUE CROSS/BLUE SHIELD

## 2023-07-27 ENCOUNTER — Telehealth: Payer: BLUE CROSS/BLUE SHIELD

## 2023-07-28 ENCOUNTER — Ambulatory Visit: Payer: BLUE CROSS/BLUE SHIELD

## 2023-07-28 DIAGNOSIS — L600 Ingrowing nail: Secondary | ICD-10-CM

## 2023-07-28 MED ORDER — IBUPROFEN 600 MG PO TABS
600 mg | ORAL_TABLET | Freq: Four times a day (QID) | ORAL | 0 refills | Status: AC | PRN
Start: 2023-07-28 — End: ?

## 2023-07-28 NOTE — Unmapped
 [id number]   Understanding Ingrown Toenails   Ingrown nails grow under the skin. They may cause pain at the tip of the toe or all the way to the base of the toe. The pain is often worse while walking. An ingrown nail may also lead to infection, inflammation, or a more serious condition. If it?s infected, you might see fluid (pus) or redness.          How is an ingrown toenail diagnosed?   Your healthcare provider will examine your toe and may press the painful area. You may have blood tests, cultures, or X-rays if the provider thinks there may be other problems.       Treatment for ingrown toenails   If the nail isn?t infected, your healthcare provider may trim the corner of it to help relieve your symptoms. They may need to remove 1 side of your nail back to the cuticle. The base of the nail may then be treated with a chemical to keep the ingrown part from growing back.   Severe infections or ingrown nails may need antibiotics. They may need temporary or permanent removal of part or all of the nail. To prevent pain, a local anesthetic may be used in these procedures. This treatment may be done at your healthcare provider's office or at a hospital. Your healthcare provider will give you instructions on cleaning and treating the area.       Preventing ingrown toenails   Many nail problems can be prevented by wearing the right shoes and trimming your nails properly. To help prevent infection, keep your feet clean and dry. If you have diabetes, talk with your healthcare provider before doing any foot self-care.   ?  The right shoes. Get your feet measured. Your shoe size may change as you age. Wear shoes that are supportive and roomy enough for your toes to wiggle. Look for shoes made of natural materials such as leather. Natural materials let air get to your feet.   ?  Correct trimming. To prevent problems, trim your toenails straight across. Don't cut down into the corners. If you can?t trim your own nails, ask your healthcare provider to do it for you.     Last Reviewed Date: 10/04/2020 00:00:00   ? 2000-2025 [employer], [place]. All rights reserved. This information is not intended as a substitute for professional medical care. Always follow your healthcare professional's instructions.

## 2023-07-28 NOTE — Patient Instructions
 If this gets worse, go to the podiatrist   Take ibuprofen if needed

## 2023-07-28 NOTE — Unmapped
 Ingrown Toenail - Video   This common problem happens when the edge of a toenail grows into the skin of your toe instead of over it. The nail may dig in deep. And that can be very painful.   To view the video go to this web address:   https://bit.ly/3TqTAvo   Or, scan this QR code with your smart phone      ? 2020 [employer]

## 2023-07-28 NOTE — Progress Notes
 [facility]  [address]  [address]  [place] [address]  Phone: [phone]  FAX: [phone]               PATIENT: Patrick Raymond  MRN: [id number]  DOB: 26-May-2006  DATE OF SERVICE: 07/28/2023      CHIEF COMPLAINT     CHIEF COMPLAINT:  Chief Complaint   Patient presents with    ingrown toenail Right Big Toenail     Lateral aspect, soaked in epsom salt, painful       HISTORY OF PRESENT ILLNESS     HISTORY OF PRESENT ILLNESS:  18 y.o. year old male complaining of ingrown toenail on his right big toenail. He has been soaking it in epsom salts. It is painful. No fever or chills.       PAST MEDICAL HISTORY     PAST MEDICAL HISTORY:  Patient Active Problem List   Diagnosis    Lumbar sprain    Onychocryptosis       CURRENT MEDICATIONS     CURRENT MEDICATIONS:  Current Outpatient Medications   Medication Sig    adapalene 0.3% gel APPLY A PEA SIZE AMOUNT TO ACNE 3X A WEEK AT NIGHT.    Clindamycin Phos-Benzoyl Perox 1.2-3.75 % GEL     doxycycline hyclate 100 mg capsule     cephalexin 500 mg capsule Take 1 capsule (500 mg total) by mouth four (4) times daily. (Patient not taking: Reported on 06/14/2023)    cephalexin 500 mg capsule Take 1 capsule (500 mg total) by mouth four (4) times daily. (Patient not taking: Reported on 06/14/2023)    CEPHALEXIN 500 mg capsule Take 1 capsule (500 mg total) by mouth four (4) times daily. (Patient not taking: Reported on 06/14/2023)    HYDROcodone-acetaminophen 5-325 mg tablet Take 1 tablet by mouth every six (6) hours as needed for Severe Pain (Pain Scale 7-10). Max Daily Amount: 4 tablets (Patient not taking: Reported on 07/28/2023)    ibuprofen 600 mg tablet Take 1 tablet (600 mg total) by mouth every six (6) hours as needed.    oxyCODONE 5 mg tablet Take 1 tablet (5 mg total) by mouth every four (4) hours as needed. Max Daily Amount: 30 mg (Patient not taking: Reported on 07/28/2023)     No current facility-administered medications for this visit.     ALLERGIES ALLERGIES:  Allergies   Allergen Reactions    Bee Venom Anaphylaxis    Influenza Vaccines      Other Reaction(s): Sensitivity     SOCIAL HISTORY     SOCIAL HISTORY:  Social History     Socioeconomic History    Marital status: Single   Tobacco Use    Smoking status: Never    Smokeless tobacco: Never   Substance and Sexual Activity    Alcohol use: Never    Drug use: Never    Sexual activity: Yes     FAMILY HISTORY     FAMILY HISTORY:  No family history on file.  REVIEW OF SYSTEMS     REVIEW OF SYSTEMS:    14 point ROS performed and negative except per HPI noted above    PHYSICAL EXAM     PHYSICAL EXAM:  BP 121/73 (BP Location: Right arm, Patient Position: Sitting, Cuff Size: Regular)  ~ Pulse 72  ~ Temp 36.6 ?C (97.8 ?F) (Oral)  ~ Resp 16  ~ Ht 5' 10'' (1.778 m)  ~ Wt 163 lb (73.9 kg)  ~  SpO2 97%  ~ BMI 23.39 kg/m?     General: No acute distress   Skin: Warm Dry, Intact, no lesions of rashes on visualized skin, normal texture and turgor   Right toe: lateral side ingrown     Procedure Note   Consent: Prior to procedure being performed, risk factors and options were discussed. This including risk of bleeding and infection as well as the option to due nothing. Verbal consent was granted to proceed with ingrown toenail removal.   Anesthetic: a digital block was performed using 2 cc of lidocaine 1% without epi with decent anesthetic effect   Procedure: Using a nail elevator a small piece of ingrown toenail was removed form the lateral side.   Aftercare:  Area was cleaned and dressed by RN.     IMPRESSION AND PLAN     IMPRESSION AND PLAN:  1. Ingrown nail of great toe of right foot  Acute, Not Controlled  Parital removal of ingrown toenail   In case there is still a portion remaining or symptoms return, a referral to podiatry was placed   - Referral to Podiatry  - ibuprofen 600 mg tablet; Take 1 tablet (600 mg total) by mouth every six (6) hours as needed.  Dispense: 60 tablet; Refill: 0     Patient Instructions   If this gets worse, go to the podiatrist   Take ibuprofen if needed       Return to clinic p.r.n.    The above plan of care, diagnosis, orders, and follow-up were discussed with the patient.  Questions related to this recommended plan of care were answered.    Follow up with PCP Pcp, No, MD      Billing by Medical Decision Making meets requirements for 2/3 areas - Problem, Data, Risk:  Problem: LOW complexity - 1 acute uncomplicated illness was addressed during the encounter.  Data Complexity: NONE   Risk: LOW risk - Low risk of morbidity from diagnostic testing or treatment.      Quanetta Truss L. Roberts-Kelly, DO

## 2023-08-01 ENCOUNTER — Ambulatory Visit: Payer: BLUE CROSS/BLUE SHIELD

## 2023-08-03 ENCOUNTER — Ambulatory Visit: Payer: BLUE CROSS/BLUE SHIELD

## 2023-09-15 ENCOUNTER — Ambulatory Visit: Payer: BLUE CROSS/BLUE SHIELD
